# Patient Record
Sex: Male | Born: 1955 | Hispanic: No | Marital: Married
Health system: Southern US, Community
[De-identification: ages and names within clinical notes are randomized; demographics above are authoritative.]

## PROBLEM LIST (undated history)

## (undated) DIAGNOSIS — S62609A Fracture of unspecified phalanx of unspecified finger, initial encounter for closed fracture: Secondary | ICD-10-CM

## (undated) HISTORY — DX: Fracture of unspecified phalanx of unspecified finger, initial encounter for closed fracture: S62.609A

---

## 1997-08-15 ENCOUNTER — Emergency Department (HOSPITAL_COMMUNITY): Admission: EM | Admit: 1997-08-15 | Discharge: 1997-08-15 | Payer: Self-pay | Admitting: Emergency Medicine

## 2000-08-09 ENCOUNTER — Emergency Department (HOSPITAL_COMMUNITY): Admission: EM | Admit: 2000-08-09 | Discharge: 2000-08-09 | Payer: Self-pay

## 2011-06-04 ENCOUNTER — Encounter (HOSPITAL_COMMUNITY): Payer: Self-pay | Admitting: Emergency Medicine

## 2011-06-04 ENCOUNTER — Emergency Department (HOSPITAL_COMMUNITY)
Admission: EM | Admit: 2011-06-04 | Discharge: 2011-06-04 | Disposition: A | Discharge status: Home / Self Care | Payer: No Typology Code available for payment source | Attending: Emergency Medicine | Admitting: Emergency Medicine

## 2011-06-04 ENCOUNTER — Emergency Department (HOSPITAL_COMMUNITY): Payer: No Typology Code available for payment source

## 2011-06-04 DIAGNOSIS — M545 Low back pain, unspecified: Secondary | ICD-10-CM | POA: Insufficient documentation

## 2011-06-04 DIAGNOSIS — IMO0002 Reserved for concepts with insufficient information to code with codable children: Secondary | ICD-10-CM

## 2011-06-04 DIAGNOSIS — F411 Generalized anxiety disorder: Secondary | ICD-10-CM

## 2011-06-04 DIAGNOSIS — M79609 Pain in unspecified limb: Secondary | ICD-10-CM | POA: Insufficient documentation

## 2011-06-04 MED ORDER — OXYCODONE-ACETAMINOPHEN 5-325 MG PO TABS: 1.0000 | ORAL_TABLET | Freq: Four times a day (QID) | ORAL | Status: AC | PRN

## 2011-06-04 MED ORDER — OXYCODONE-ACETAMINOPHEN 5-325 MG PO TABS
1.0000 | ORAL_TABLET | Freq: Once | ORAL | Status: AC
  Administered 2011-06-04: 1 via ORAL
  Filled 2011-06-04: qty 1

## 2011-06-04 MED ORDER — LORAZEPAM 1 MG PO TABS
0.5000 mg | ORAL_TABLET | Freq: Once | ORAL | Status: AC
  Administered 2011-06-04: 0.5 mg via ORAL
  Filled 2011-06-04: qty 1

## 2011-06-04 MED ORDER — LORAZEPAM 0.5 MG PO TABS: 0.5000 mg | ORAL_TABLET | Freq: Three times a day (TID) | ORAL | Status: AC | PRN

## 2011-06-04 NOTE — ED Notes (Signed)
Pt st's pain is better after pain med.
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
## 2011-06-04 NOTE — ED Notes (Signed)
Pt ambulatory to bathroom without any problems 

## 2012-07-27 ENCOUNTER — Emergency Department: Payer: Self-pay | Admitting: Emergency Medicine

## 2013-06-16 ENCOUNTER — Ambulatory Visit (INDEPENDENT_AMBULATORY_CARE_PROVIDER_SITE_OTHER): Payer: Commercial Managed Care - PPO | Admitting: Emergency Medicine

## 2013-06-16 VITALS — BP 150/100 | HR 71 | Temp 98.6°F | Resp 20

## 2013-06-16 DIAGNOSIS — M549 Dorsalgia, unspecified: Secondary | ICD-10-CM

## 2013-06-16 MED ORDER — KETOROLAC TROMETHAMINE 60 MG/2ML IM SOLN
60.0000 mg | Freq: Once | INTRAMUSCULAR | Status: AC
Start: 2013-06-16 — End: 2013-06-16
  Administered 2013-06-16: 60 mg via INTRAMUSCULAR
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
## 2018-12-01 ENCOUNTER — Other Ambulatory Visit: Payer: Self-pay

## 2018-12-01 DIAGNOSIS — Z20822 Contact with and (suspected) exposure to covid-19: Secondary | ICD-10-CM

## 2018-12-03 LAB — NOVEL CORONAVIRUS, NAA: SARS-CoV-2, NAA: NOT DETECTED

## 2020-06-21 ENCOUNTER — Other Ambulatory Visit: Payer: Self-pay

## 2020-06-21 ENCOUNTER — Ambulatory Visit (HOSPITAL_COMMUNITY)
Admission: EM | Admit: 2020-06-21 | Discharge: 2020-06-21 | Disposition: A | Discharge status: Home / Self Care | Payer: Medicare Other | Attending: Medical Oncology | Admitting: Medical Oncology

## 2020-06-21 ENCOUNTER — Encounter (HOSPITAL_COMMUNITY): Payer: Self-pay

## 2020-06-21 ENCOUNTER — Ambulatory Visit (INDEPENDENT_AMBULATORY_CARE_PROVIDER_SITE_OTHER): Payer: Medicare Other

## 2020-06-21 DIAGNOSIS — R059 Cough, unspecified: Secondary | ICD-10-CM | POA: Diagnosis not present

## 2020-06-21 DIAGNOSIS — R030 Elevated blood-pressure reading, without diagnosis of hypertension: Secondary | ICD-10-CM | POA: Insufficient documentation

## 2020-06-21 DIAGNOSIS — I10 Essential (primary) hypertension: Secondary | ICD-10-CM | POA: Insufficient documentation

## 2020-06-21 DIAGNOSIS — M546 Pain in thoracic spine: Secondary | ICD-10-CM | POA: Insufficient documentation

## 2020-06-21 DIAGNOSIS — R051 Acute cough: Secondary | ICD-10-CM | POA: Diagnosis not present

## 2020-06-21 LAB — COMPREHENSIVE METABOLIC PANEL
ALT: 21 U/L (ref 0–44)
AST: 20 U/L (ref 15–41)
Albumin: 4 g/dL (ref 3.5–5.0)
Alkaline Phosphatase: 73 U/L (ref 38–126)
Anion gap: 5 (ref 5–15)
BUN: 16 mg/dL (ref 8–23)
CO2: 30 mmol/L (ref 22–32)
Calcium: 9.1 mg/dL (ref 8.9–10.3)
Chloride: 106 mmol/L (ref 98–111)
Creatinine, Ser: 0.96 mg/dL (ref 0.61–1.24)
GFR, Estimated: >60 mL/min (ref >60)
Glucose, Bld: 101 mg/dL — ABNORMAL HIGH (ref 70–99)
Potassium: 4.2 mmol/L (ref 3.5–5.1)
Sodium: 141 mmol/L (ref 135–145)
Total Bilirubin: 0.8 mg/dL (ref 0.3–1.2)
Total Protein: 7 g/dL (ref 6.5–8.1)

## 2020-06-21 LAB — POCT URINALYSIS DIPSTICK, ED / UC
Bilirubin Urine: NEGATIVE
Glucose, UA: NEGATIVE mg/dL
Hgb urine dipstick: NEGATIVE
Ketones, ur: NEGATIVE mg/dL
Leukocytes,Ua: NEGATIVE
Nitrite: NEGATIVE
Protein, ur: NEGATIVE mg/dL
Specific Gravity, Urine: 1.025 (ref 1.005–1.030)
Urobilinogen, UA: 0.2 mg/dL (ref 0.0–1.0)
pH: 6 (ref 5.0–8.0)

## 2020-06-21 MED ORDER — AMLODIPINE BESYLATE 10 MG PO TABS: 10.0000 mg | ORAL_TABLET | Freq: Every day | ORAL | 0 refills | Status: AC

## 2020-06-21 MED ORDER — TIZANIDINE HCL 4 MG PO TABS: 4.0000 mg | ORAL_TABLET | Freq: Three times a day (TID) | ORAL | 0 refills | Status: AC | PRN

## 2020-06-21 NOTE — ED Provider Notes (Signed)
[facility]   MRN: [id number] DOB: 1955/10/11  Subjective:   Gary Wells is a 65 y.o. male presenting for 1 month history of persistent lower lateral thoracic back pain/right flank pain.  Patient has also had a cough.  Woke up and felt dizziness and unsteadiness this morning which resolved after a few minutes.  Regarding his blood pressure, states that he does not get regular care.  Cannot recall the last time he went to a doctor for health maintenance.  Denies any active headache, confusion, double vision, dizziness, chest pain, shortness of breath, nausea, vomiting, abdominal pain, hematuria, dysuria.  No history of kidney stones.  He last had COVID-19 in November 2021.  No current facility-administered medications for this encounter.  Current Outpatient Medications:    UNABLE TO FIND, Med Name: reports taking tylenol or advil for headaches as needed., Disp: , Rfl:    aspirin EC 325 MG tablet, Take 325 mg by mouth daily as needed. For headache, Disp: , Rfl:    cyclobenzaprine (FLEXERIL) 10 MG tablet, Take 10 mg by mouth 3 (three) times daily as needed for muscle spasms., Disp: , Rfl:    omega-3 acid ethyl esters (LOVAZA) 1 G capsule, Take 1 g by mouth 2 (two) times daily., Disp: , Rfl:    predniSONE (DELTASONE) 10 MG tablet, Take 10 mg by mouth as directed., Disp: , Rfl:    No Known Allergies  Past Medical History:  Diagnosis Date   Broken fingers      History reviewed. No pertinent surgical history.  Family History  Problem Relation Age of Onset   Heart attack Mother     Social History   Substance Use Topics   Alcohol use: Not Currently   Drug use: Not Currently    ROS   Objective:   Vitals: BP (!) 192/98   Pulse 79   Temp 98.6 F (37 C)   Resp 18   SpO2 97%   BP Readings from Last 3 Encounters:  06/21/20 (!) 192/98  06/16/13 (!) 150/100  06/04/11 (!) 152/102   Physical Exam Constitutional:      General: He is not in acute  distress.    Appearance: Normal appearance. He is well-developed. He is not ill-appearing, toxic-appearing or diaphoretic.  HENT:     Head: Normocephalic and atraumatic.     Right Ear: External ear normal.     Left Ear: External ear normal.     Nose: Nose normal.     Mouth/Throat:     Mouth: Mucous membranes are moist.     Pharynx: Oropharynx is clear.  Eyes:     General: No scleral icterus.    Extraocular Movements: Extraocular movements intact.     Pupils: Pupils are equal, round, and reactive to light.  Cardiovascular:     Rate and Rhythm: Normal rate and regular rhythm.     Heart sounds: Normal heart sounds. No murmur heard.   No friction rub. No gallop.  Pulmonary:     Effort: Pulmonary effort is normal. No respiratory distress.     Breath sounds: Normal breath sounds. No stridor. No wheezing, rhonchi or rales.  Skin:    General: Skin is warm and dry.  Neurological:     Mental Status: He is alert and oriented to person, place, and time.  Psychiatric:        Mood and Affect: Mood normal.        Behavior: Behavior normal.  Thought Content: Thought content normal.    ED ECG REPORT   Date: 06/21/2020  Rate: 72bpm  Rhythm: normal sinus rhythm  QRS Axis: left  Intervals: normal  ST/T Wave abnormalities: nonspecific T wave changes  Conduction Disutrbances:none  Narrative Interpretation: Sinus rhythm at 72 bpm with left axis deviation and nonspecific T wave flattening in lead III.  No previous EKG available for comparison.  Old EKG Reviewed: none available  I have personally reviewed the EKG tracing and agree with the computerized printout as noted.   DG Chest 2 View  Result Date: 06/21/2020 CLINICAL DATA:  Cough with thoracic back pain. EXAM: CHEST - 2 VIEW COMPARISON:  None. FINDINGS: No solid a shin. No visible pleural effusions or pneumothorax. Cardiomediastinal silhouette is within normal limits. Flowing anterior osteophytes in the thoracic spine, suggestive of  diffuse idiopathic skeletal hyperostosis (DISH). Vertebral body heights are maintained. IMPRESSION: 1. No evidence of active cardiopulmonary disease. 2. 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 Electronically Signed   By: Feliberto Harts MD   On: 06/21/2020 12:07     Results for orders placed or performed during the hospital encounter of 06/21/20 (from the past 24 hour(s))  Comprehensive metabolic panel     Status: Abnormal   Collection Time: 06/21/20 12:07 PM  Result Value Ref Range   Sodium 141 135 - 145 mmol/L   Potassium 4.2 3.5 - 5.1 mmol/L   Chloride 106 98 - 111 mmol/L   CO2 30 22 - 32 mmol/L   Glucose, Bld 101 (H) 70 - 99 mg/dL   BUN 16 8 - 23 mg/dL   Creatinine, Ser 3.71 0.61 - 1.24 mg/dL   Calcium 9.1 8.9 - 06.2 mg/dL   Total Protein 7.0 6.5 - 8.1 g/dL   Albumin 4.0 3.5 - 5.0 g/dL   AST 20 15 - 41 U/L   ALT 21 0 - 44 U/L   Alkaline Phosphatase 73 38 - 126 U/L   Total Bilirubin 0.8 0.3 - 1.2 mg/dL   GFR, Estimated >69 >48 mL/min   Anion gap 5 5 - 15  POC Urinalysis dipstick     Status: None   Collection Time: 06/21/20 12:16 PM  Result Value Ref Range   Glucose, UA NEGATIVE NEGATIVE mg/dL   Bilirubin Urine NEGATIVE NEGATIVE   Ketones, ur NEGATIVE NEGATIVE mg/dL   Specific Gravity, Urine 1.025 1.005 - 1.030   Hgb urine dipstick NEGATIVE NEGATIVE   pH 6.0 5.0 - 8.0   Protein, ur NEGATIVE NEGATIVE mg/dL   Urobilinogen, UA 0.2 0.0 - 1.0 mg/dL   Nitrite NEGATIVE NEGATIVE   Leukocytes,Ua NEGATIVE NEGATIVE    Assessment and Plan :   PDMP not reviewed this encounter.  1. Acute right-sided thoracic back pain   2. Essential hypertension   3. Elevated blood pressure reading     Patient has severely elevated blood pressure and was symptomatic this morning.  On exam he has a normal neurologic, cardiopulmonary exam.  Point-of-care testing including EKG, chest x-ray, urinalysis and blood work are reassuring.  Emphasized  need for better blood pressure control, start amlodipine 10 mg, follow-up with PCP as soon as possible.  Information provided to the patient to establish care with a new PCP.  Suspect musculoskeletal pain and therefore will have patient take tizanidine, Tylenol.  Strict ER precautions. Counseled patient on potential for adverse effects with medications prescribed today, patient verbalized understanding.    Wallis Bamberg, PA-C 06/21/20 1409

## 2020-06-21 NOTE — Discharge Instructions (Addendum)

## 2020-06-21 NOTE — ED Triage Notes (Signed)
Pt woke up this morning 0600 with dizziness, reports thinks it is his blood pressure. Reports stopped feeling dizzy 0930. BP 192/98.  Pt would also like to be seen for right side back pain x1 month. States pain comes on at night when laying down.

## 2020-06-22 ENCOUNTER — Telehealth (HOSPITAL_BASED_OUTPATIENT_CLINIC_OR_DEPARTMENT_OTHER): Payer: Self-pay

## 2020-06-22 NOTE — Telephone Encounter (Signed)
-----   Message from Aaron Edelman, RN sent at 06/22/2020  8:20 AM EDT ----- Regarding: UC to PCP Patient needs to establish with PCP - routine

## 2021-10-05 ENCOUNTER — Emergency Department (HOSPITAL_COMMUNITY)
Admission: EM | Admit: 2021-10-05 | Discharge: 2021-10-06 | Disposition: A | Discharge status: Home / Self Care | Payer: Medicare Other | Attending: Emergency Medicine | Admitting: Emergency Medicine

## 2021-10-05 ENCOUNTER — Emergency Department (HOSPITAL_COMMUNITY): Payer: Medicare Other

## 2021-10-05 ENCOUNTER — Other Ambulatory Visit: Payer: Self-pay

## 2021-10-05 DIAGNOSIS — X500XXA Overexertion from strenuous movement or load, initial encounter: Secondary | ICD-10-CM | POA: Diagnosis not present

## 2021-10-05 DIAGNOSIS — M25561 Pain in right knee: Secondary | ICD-10-CM | POA: Insufficient documentation

## 2021-10-05 DIAGNOSIS — M1711 Unilateral primary osteoarthritis, right knee: Secondary | ICD-10-CM | POA: Diagnosis not present

## 2021-10-05 DIAGNOSIS — M25461 Effusion, right knee: Secondary | ICD-10-CM | POA: Diagnosis not present

## 2021-10-05 NOTE — ED Provider Triage Note (Signed)
Emergency Medicine Provider Triage Evaluation Note  Gary Wells , a 66 y.o. male  was evaluated in triage.  Pt complains of right knee pain after moving furniture the other day.  He complains of sharp pain in his right knee.  History of the same.  He states that sometimes it swollen in the morning.  Pain is worse with ambulation..  Review of Systems  Positive: Knee pain  Negative: Fever   Physical Exam  BP (!) 132/92   Pulse 73   Temp 98.2 F (36.8 C) (Oral)   Resp 16   SpO2 97%  Gen:   Awake, no distress   Resp:  Normal effort  MSK:   Moves extremities without difficulty  Other:    Medical Decision Making  Medically screening exam initiated at 5:58 PM.  Appropriate orders placed.  Gary Wells was informed that the remainder of the evaluation will be completed by another provider, this initial triage assessment does not replace that evaluation, and the importance of remaining in the ED until their evaluation is complete.  Work up initiated   Margarita Mail, PA-C 10/05/21 1808

## 2021-10-05 NOTE — ED Triage Notes (Signed)
Pt here for R knee pain after moving furniture at work 2 days ago. Pt states he has been working but has had increased in pain. Pt states knee was swollen 2 days ago, no longer swollen now. Pain worse w/ ambulation.

## 2021-10-06 MED ORDER — NAPROXEN 500 MG PO TABS: 500.0000 mg | ORAL_TABLET | Freq: Two times a day (BID) | ORAL | 0 refills | Status: AC

## 2021-10-06 NOTE — ED Provider Notes (Signed)
[facility] EMERGENCY DEPARTMENT Provider Note   CSN: [id number] Arrival date & time: 10/05/21  1704     History  Chief Complaint  Patient presents with   Knee Pain    Gary Wells is a 66 y.o. male.  Patient with a complaint of right knee pain after moving furniture 2 days ago.  Patient able to ambulate on it.  Patient knee was swollen but that starting to improve.  No direct blow or injury.  No other complaint.  Past medical history noncontributory.  Patient does not use tobacco.       Home Medications Prior to Admission medications   Medication Sig Start Date End Date Taking? Authorizing Provider  naproxen (NAPROSYN) 500 MG tablet Take 1 tablet (500 mg total) by mouth 2 (two) times daily. 10/06/21  Yes Vanetta Mulders, MD  amLODipine (NORVASC) 10 MG tablet Take 1 tablet (10 mg total) by mouth daily. 06/21/20   Wallis Bamberg, PA-C  aspirin EC 325 MG tablet Take 325 mg by mouth daily as needed. For headache    [provider]  cyclobenzaprine (FLEXERIL) 10 MG tablet Take 10 mg by mouth 3 (three) times daily as needed for muscle spasms.    [provider]  omega-3 acid ethyl esters (LOVAZA) 1 G capsule Take 1 g by mouth 2 (two) times daily.    [provider]  predniSONE (DELTASONE) 10 MG tablet Take 10 mg by mouth as directed.    [provider]  tiZANidine (ZANAFLEX) 4 MG tablet Take 1 tablet (4 mg total) by mouth every 8 (eight) hours as needed. 06/21/20   Wallis Bamberg, PA-C  UNABLE TO FIND Med Name: reports taking tylenol or advil for headaches as needed.    [provider]      Allergies    Patient has no known allergies.    Review of Systems   Review of Systems  Constitutional:  Negative for chills and fever.  HENT:  Negative for ear pain and sore throat.   Eyes:  Negative for pain and visual disturbance.  Respiratory:  Negative for cough and shortness of breath.   Cardiovascular:  Negative for chest pain  and palpitations.  Gastrointestinal:  Negative for abdominal pain and vomiting.  Genitourinary:  Negative for dysuria and hematuria.  Musculoskeletal:  Positive for joint swelling. Negative for arthralgias and back pain.  Skin:  Negative for color change and rash.  Neurological:  Negative for seizures and syncope.  All other systems reviewed and are negative.   Physical Exam Updated Vital Signs BP 139/89   Pulse (!) 58   Temp 98.2 F (36.8 C) (Oral)   Resp 20   SpO2 99%  Physical Exam Vitals and nursing note reviewed.  Constitutional:      General: He is not in acute distress.    Appearance: He is well-developed.  HENT:     Head: Normocephalic and atraumatic.  Eyes:     Conjunctiva/sclera: Conjunctivae normal.  Cardiovascular:     Rate and Rhythm: Normal rate and regular rhythm.     Heart sounds: No murmur heard. Pulmonary:     Effort: Pulmonary effort is normal. No respiratory distress.     Breath sounds: Normal breath sounds.  Abdominal:     Palpations: Abdomen is soft.     Tenderness: There is no abdominal tenderness.  Musculoskeletal:        General: Swelling and tenderness present. No deformity.     Cervical back: Normal range  of motion and neck supple.     Right lower leg: No edema.     Left lower leg: No edema.     Comments: Right knee with some tenderness to joint line and around the patella area.  With slight swelling.  Distally dorsalis pedis pulse 2+.  Same for left lower extremity.  Sensation intact.  No tenderness anywhere else on the right lower extremity.  Skin:    General: Skin is warm and dry.     Capillary Refill: Capillary refill takes less than 2 seconds.  Neurological:     General: No focal deficit present.     Mental Status: He is alert and oriented to person, place, and time.     Cranial Nerves: No cranial nerve deficit.     Sensory: No sensory deficit.     Motor: No weakness.  Psychiatric:        Mood and Affect: Mood normal.     ED  Results / Procedures / Treatments   Labs (all labs ordered are listed, but only abnormal results are displayed) Labs Reviewed - No data to display  EKG None  Radiology DG Knee Complete 4 Views Right  Result Date: 10/05/2021 CLINICAL DATA:  Knee pain EXAM: RIGHT KNEE - COMPLETE 4+ VIEW COMPARISON:  None Available. FINDINGS: No fracture or malalignment. Mild patellofemoral and lateral joint space degenerative change. Positive for knee effusion. IMPRESSION: Mild degenerative change with knee effusion Electronically Signed   By: Donavan Foil M.D.   On: 10/05/2021 18:47    Procedures Procedures    Medications Ordered in ED Medications - No data to display  ED Course/ Medical Decision Making/ A&P Clinical Course as of 10/06/21 1307  Fri Oct 05, 2021  2026 DG Knee Complete 4 Views Right [AH]    Clinical Course User Index [AH] Margarita Mail, PA-C                           Medical Decision Making Risk Prescription drug management.  X-ray shows mild degenerative changes with knee effusion.  Knee is not red or hot not concerned about septic joint.  Feel that this is arthritic or overuse syndrome or perhaps an injury from moving the furniture.  We will treat with knee immobilizer have him take Naprosyn and extra strength Tylenol follow-up with sports medicine.  Patient is able to ambulate fine  Final Clinical Impression(s) / ED Diagnoses Final diagnoses:  Acute pain of right knee    Rx / DC Orders ED Discharge Orders          Ordered    naproxen (NAPROSYN) 500 MG tablet  2 times daily        10/06/21 1301              Fredia Sorrow, MD 10/06/21 1307

## 2021-10-06 NOTE — Discharge Instructions (Signed)
Wear the knee immobilizer for comfort can remove it to shower.  Would recommend wearing it at all times.  Take the Naprosyn prescription provided along with EXTR strength Tylenol for the pain.  Call sports medicine for follow-up.  X-ray showed evidence suggest some arthritic changes in the knee.

## 2021-10-06 NOTE — ED Notes (Signed)
Called pt x3 for room, no response. 

## 2021-10-06 NOTE — Progress Notes (Signed)
Orthopedic Tech Progress Note Patient Details:  Gary Wells 07/16/1955 [id number]  Arrived to pt room at 1315 to apply knee immobilizer, but the pt had already been d/c. Was told by one of the ED paramedics that he applied the knee immobilizer.  Patient ID: Gary Wells, male   DOB: [birth year], 66 y.o.   MRN: [id number]  [name] 10/06/2021, 1:23 PM

## 2022-11-28 DIAGNOSIS — M5430 Sciatica, unspecified side: Secondary | ICD-10-CM | POA: Diagnosis not present

## 2022-12-02 IMAGING — DX DG CHEST 2V
2 series · 2 of 2 positions shown · non-contrast
Comparison: None.

CLINICAL DATA: Cough with thoracic back pain.

EXAM:
CHEST - 2 VIEW

[chest pa]
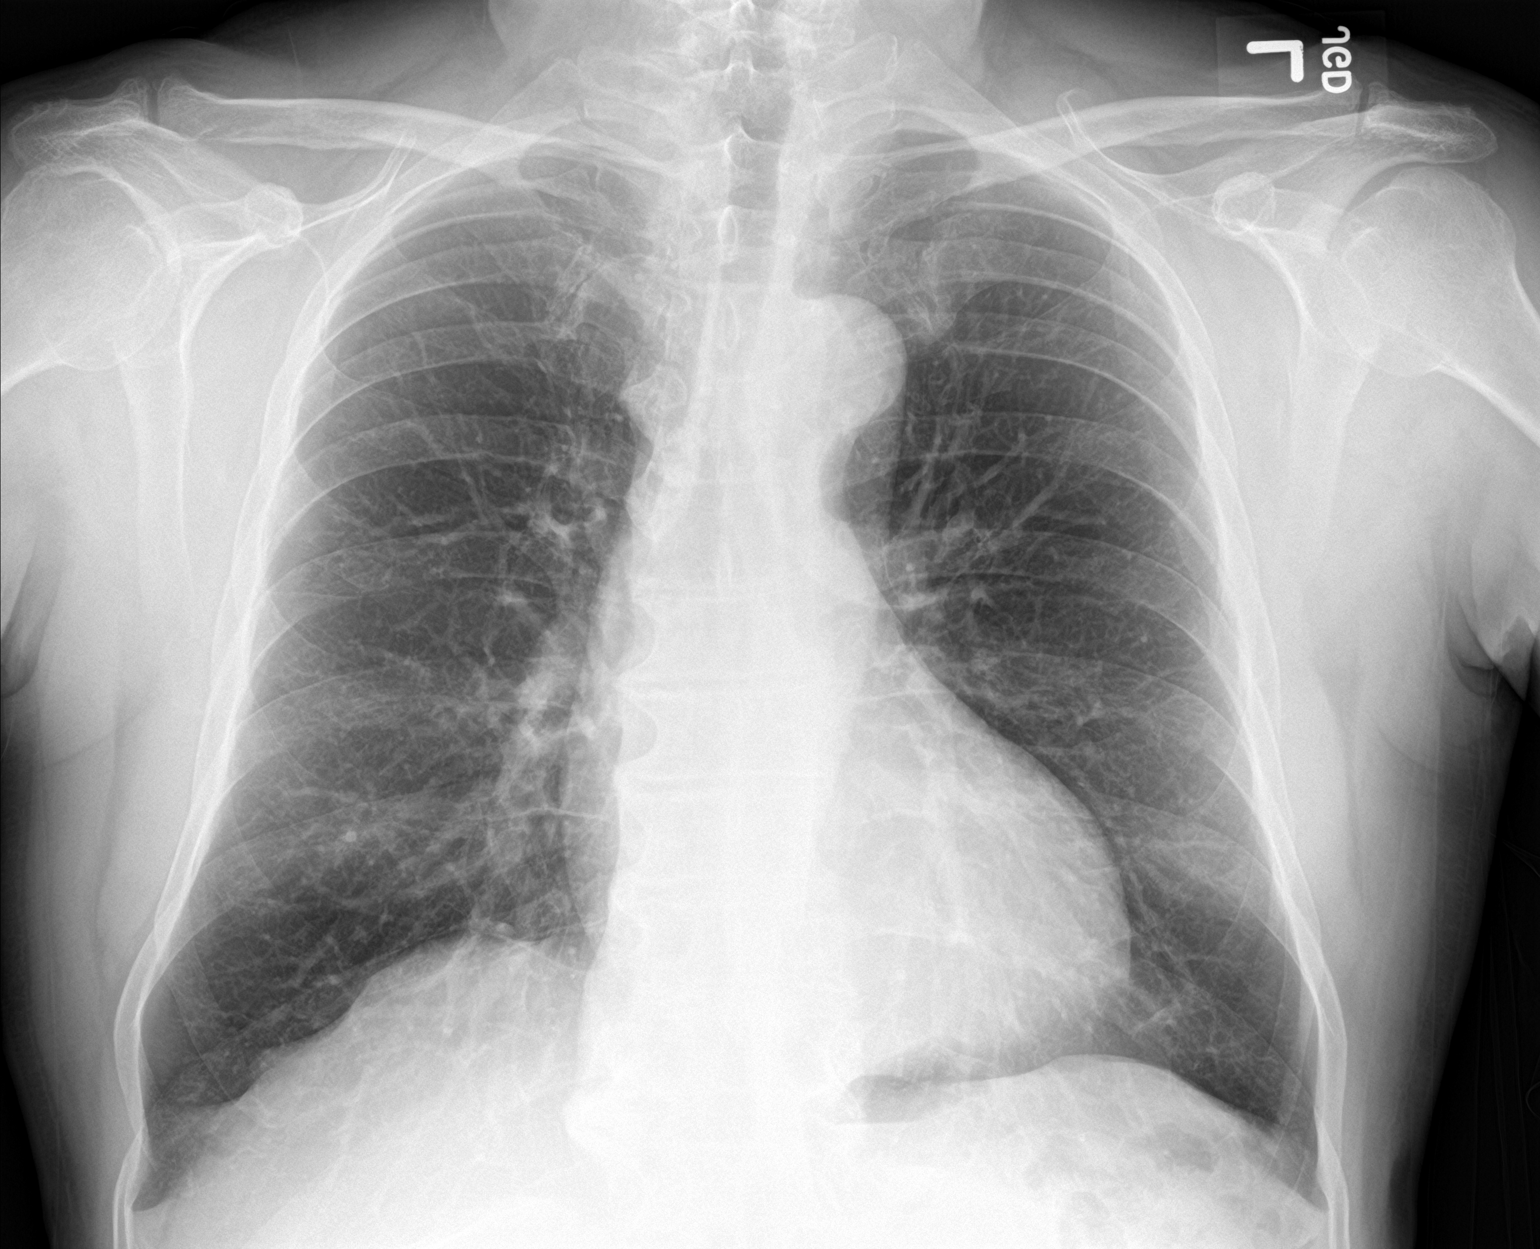

[chest lat]
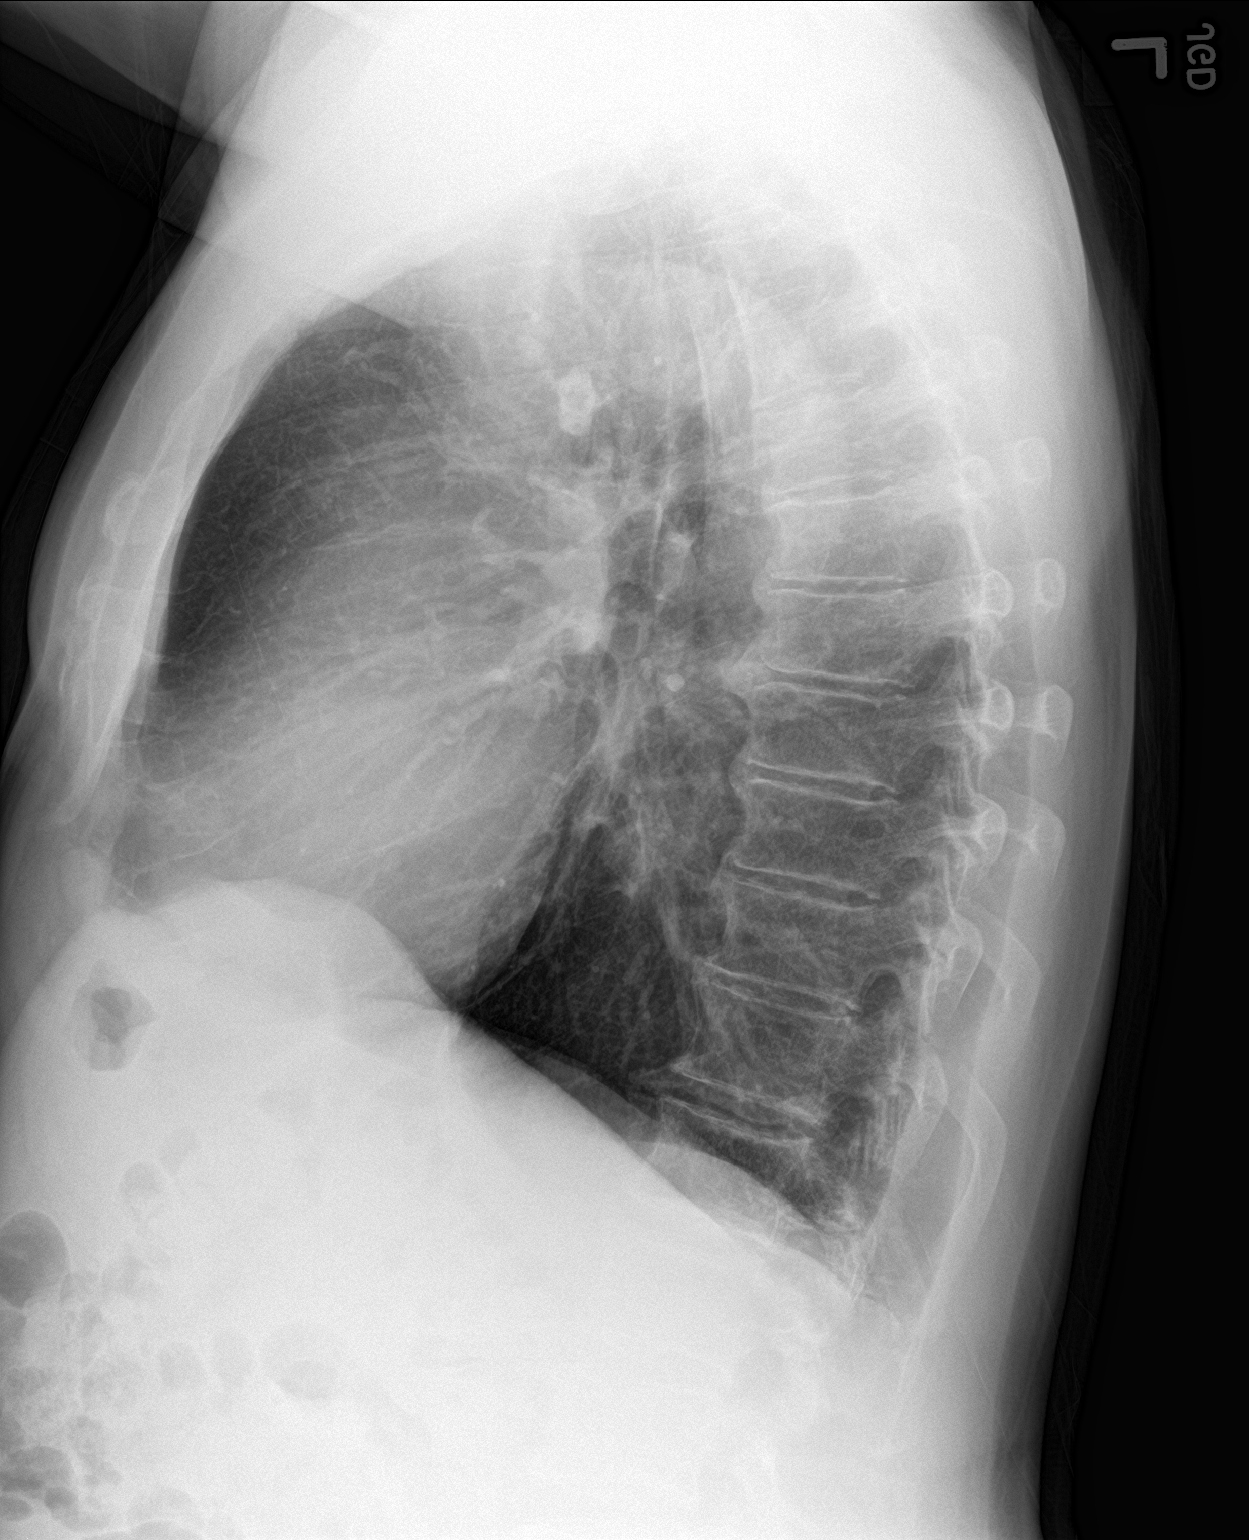

[2 of 2 positions shown; findings below may reference images not displayed]

FINDINGS: No solid a shin. No visible pleural effusions or pneumothorax.
Cardiomediastinal silhouette is within normal limits. Flowing
anterior osteophytes in the thoracic spine, suggestive of diffuse
idiopathic skeletal hyperostosis (DISH). Vertebral body heights are
maintained.
IMPRESSION: 1. No evidence of active cardiopulmonary disease.
2. Findings suggestive of diffuse idiopathic skeletal hyperostosis
(DISH) in the thoracic spine.
